# Patient Record
Sex: Male | Born: 1975 | Race: Black or African American | Hispanic: No | Marital: Single | Smoking: Current every day smoker
Health system: Southern US, Community
[De-identification: ages and names within clinical notes are randomized; demographics above are authoritative.]

## PROBLEM LIST (undated history)

## (undated) HISTORY — PX: FRACTURE SURGERY: SHX138

---

## 1998-03-18 ENCOUNTER — Emergency Department (HOSPITAL_COMMUNITY): Admission: EM | Admit: 1998-03-18 | Discharge: 1998-03-18 | Payer: Self-pay | Admitting: Emergency Medicine

## 2003-07-25 ENCOUNTER — Emergency Department (HOSPITAL_COMMUNITY): Admission: EM | Admit: 2003-07-25 | Discharge: 2003-07-25 | Payer: Self-pay | Admitting: Emergency Medicine

## 2003-08-04 ENCOUNTER — Emergency Department (HOSPITAL_COMMUNITY): Admission: EM | Admit: 2003-08-04 | Discharge: 2003-08-04 | Payer: Self-pay | Admitting: Emergency Medicine

## 2004-11-19 ENCOUNTER — Emergency Department (HOSPITAL_COMMUNITY): Admission: EM | Admit: 2004-11-19 | Discharge: 2004-11-19 | Payer: Self-pay | Admitting: Family Medicine

## 2004-11-20 ENCOUNTER — Emergency Department (HOSPITAL_COMMUNITY): Admission: EM | Admit: 2004-11-20 | Discharge: 2004-11-20 | Payer: Self-pay | Admitting: Family Medicine

## 2004-11-21 ENCOUNTER — Emergency Department (HOSPITAL_COMMUNITY): Admission: EM | Admit: 2004-11-21 | Discharge: 2004-11-21 | Payer: Self-pay | Admitting: Family Medicine

## 2004-11-22 ENCOUNTER — Emergency Department (HOSPITAL_COMMUNITY): Admission: EM | Admit: 2004-11-22 | Discharge: 2004-11-22 | Payer: Self-pay | Admitting: Family Medicine

## 2004-11-26 ENCOUNTER — Emergency Department (HOSPITAL_COMMUNITY): Admission: EM | Admit: 2004-11-26 | Discharge: 2004-11-26 | Payer: Self-pay | Admitting: Family Medicine

## 2005-12-28 ENCOUNTER — Encounter: Admission: RE | Admit: 2005-12-28 | Discharge: 2005-12-28 | Payer: Self-pay | Admitting: Sports Medicine

## 2010-06-25 ENCOUNTER — Emergency Department (HOSPITAL_COMMUNITY): Admission: EM | Admit: 2010-06-25 | Discharge: 2010-06-25 | Payer: Self-pay | Admitting: Emergency Medicine

## 2010-12-04 LAB — POCT I-STAT, CHEM 8
BUN: 13 mg/dL (ref 6–23)
Calcium, Ion: 1.11 mmol/L — ABNORMAL LOW (ref 1.12–1.32)
Chloride: 103 mEq/L (ref 96–112)
Creatinine, Ser: 1.2 mg/dL (ref 0.4–1.5)
Glucose, Bld: 108 mg/dL — ABNORMAL HIGH (ref 70–99)
HCT: 50 % (ref 39.0–52.0)
Hemoglobin: 17 g/dL (ref 13.0–17.0)
Potassium: 4.6 mEq/L (ref 3.5–5.1)
Sodium: 139 mEq/L (ref 135–145)
TCO2: 29 mmol/L (ref 0–100)

## 2012-11-23 ENCOUNTER — Emergency Department (HOSPITAL_COMMUNITY)
Admission: EM | Admit: 2012-11-23 | Discharge: 2012-11-23 | Disposition: A | Discharge status: Home / Self Care | Payer: 59 | Attending: Emergency Medicine | Admitting: Emergency Medicine

## 2012-11-23 ENCOUNTER — Emergency Department (HOSPITAL_COMMUNITY): Payer: 59

## 2012-11-23 ENCOUNTER — Encounter (HOSPITAL_COMMUNITY): Payer: Self-pay | Admitting: *Deleted

## 2012-11-23 DIAGNOSIS — Y9389 Activity, other specified: Secondary | ICD-10-CM | POA: Insufficient documentation

## 2012-11-23 DIAGNOSIS — S52121A Displaced fracture of head of right radius, initial encounter for closed fracture: Secondary | ICD-10-CM

## 2012-11-23 DIAGNOSIS — Y990 Civilian activity done for income or pay: Secondary | ICD-10-CM | POA: Insufficient documentation

## 2012-11-23 DIAGNOSIS — W11XXXA Fall on and from ladder, initial encounter: Secondary | ICD-10-CM | POA: Insufficient documentation

## 2012-11-23 DIAGNOSIS — S52123A Displaced fracture of head of unspecified radius, initial encounter for closed fracture: Secondary | ICD-10-CM | POA: Insufficient documentation

## 2012-11-23 DIAGNOSIS — Y9289 Other specified places as the place of occurrence of the external cause: Secondary | ICD-10-CM | POA: Insufficient documentation

## 2012-11-23 MED ORDER — OXYCODONE-ACETAMINOPHEN 5-325 MG PO TABS
1.0000 | ORAL_TABLET | Freq: Once | ORAL | Status: AC
  Administered 2012-11-23: 1 via ORAL
  Filled 2012-11-23: qty 1

## 2012-11-23 MED ORDER — PERCOCET 5-325 MG PO TABS: 1.0000 | ORAL_TABLET | Freq: Four times a day (QID) | ORAL | Status: DC | PRN

## 2012-11-23 MED ORDER — NAPROXEN 500 MG PO TABS: 500.0000 mg | ORAL_TABLET | Freq: Two times a day (BID) | ORAL | Status: DC

## 2012-11-23 NOTE — Progress Notes (Signed)
Orthopedic Tech Progress Note Patient Details:  Raymond Fleming 1975-10-13 [id number]  Ortho Devices Type of Ortho Device: Arm sling;Post (long arm) splint;Ace wrap Ortho Device/Splint Location: right arm Ortho Device/Splint Interventions: Application   Ruthetta Koopmann 11/23/2012, 7:08 PM

## 2012-11-23 NOTE — ED Provider Notes (Signed)
History    This chart was scribed for non-physician practitioner working with Joya Gaskins, MD by Sofie Rower, ED Scribe. This patient was seen in room TR10C/TR10C and the patient's care was started at 6:11Pm.   CSN: [id number]  Arrival date & time 11/23/12  1645   First MD Initiated Contact with Patient 11/23/12 1811      Chief Complaint  Patient presents with  . Arm Injury    (Consider location/radiation/quality/duration/timing/severity/associated sxs/prior treatment) The history is provided by the patient. No language interpreter was used.    Raymond Fleming is a 37 y.o. male , with no known medical hx,  who presents to the Emergency Department complaining of sudden, progressively worsening, arm pain, located at the RUE,  onset today (11/23/12). The pt reports he fell off a ladder (the height of the fall is reported at 8 feet) while working with a drill earlier this afternoon. At the time of the incident the pt informs he impacted directly upon his right forearm. The pt has not taken any medications PTA to relieve his right arm pain. Modifying factors include certain movements and positions which intensify the right arm pain.   The pt denies numbness, tingling, and weakness within the RUE. Furthermore, the pt denies hitting his head, LOC, and back pain.   The pt does not smoke or drink alcohol.     History reviewed. No pertinent past medical history.  History reviewed. No pertinent past surgical history.  History reviewed. No pertinent family history.  History  Substance Use Topics  . Smoking status: Not on file  . Smokeless tobacco: Not on file  . Alcohol Use: No      Review of Systems  Musculoskeletal: Positive for arthralgias.  All other systems reviewed and are negative.    Allergies  Review of patient's allergies indicates no known allergies.  Home Medications  No current outpatient prescriptions on file.  BP 139/92  Pulse 83  Temp(Src) 97.7 F (36.5  C) (Oral)  Resp 18  SpO2 97%  Physical Exam  Nursing note and vitals reviewed. Constitutional: He appears well-developed and well-nourished. No distress.  HENT:  Head: Normocephalic and atraumatic.  Eyes: Conjunctivae and EOM are normal.  Neck: Normal range of motion. Neck supple.  Cardiovascular:  Intact distal pulses, capillary refill < 3 seconds  Musculoskeletal:       Right forearm: He exhibits tenderness.  All other extremities with normal ROM  Neurological:  No sensory deficit  Skin: He is not diaphoretic.  Skin intact, no tenting    ED Course  Procedures (including critical care time)  DIAGNOSTIC STUDIES: Oxygen Saturation is 97% on room air, normal by my interpretation.    COORDINATION OF CARE:  6:47 PM- Treatment plan concerning radiology results discussed with patient and family members. Pt agrees with treatment.  Splinting also discussed with attending who agrees with discharge plan   Labs Reviewed - No data to display Dg Elbow Complete Right  11/23/2012  *RADIOLOGY REPORT*  Clinical Data: Pain post fall  RIGHT ELBOW - COMPLETE 3+ VIEW  Comparison: none  Findings: There is a mildly impacted minimally-displaced radial head fracture.  Positive elbow effusion.  Distal humerus and proximal ulna appear intact.  No dislocation. No significant osseous degenerative change. IMPRESSION:  Radial head fracture with effusion     Original Report Authenticated By: D. Andria Rhein, MD      No diagnosis found.    MDM   mechanical fall radial head fracture  Patient emergency department status post fall from ladder while at work complaining of right elbow pain.  Radial head fracture with effusion seen on imaging.  Pain managed in emergency department and placed in posterior splint.  Advised to followup with orthopedics.  Patient verbalizes understanding.  No other complaints this time.  At this time there does not appear to be any evidence of an acute emergency medical  condition and the patient appears stable for discharge with appropriate outpatient follow up.Diagnosis was discussed with patient who verbalizes understanding and is agreeable to discharge. Pt case discussed with Dr. Bebe Shaggy who agrees with my plan.      I personally performed the services described in this documentation, which was scribed in my presence. The recorded information has been reviewed and is accurate.      Jaci Carrel, [place] 11/23/12 2305

## 2012-11-23 NOTE — ED Notes (Signed)
Pt reports falling approx 78ft off ladder today, denies loc, having pain to right elbow.

## 2012-11-25 NOTE — ED Provider Notes (Signed)
Medical screening examination/treatment/procedure(s) were performed by non-physician practitioner and as supervising physician I was immediately available for consultation/collaboration.   Joya Gaskins, MD 11/25/12 [phone]

## 2013-07-17 IMAGING — CR DG ELBOW COMPLETE 3+V*R*
4 series · 4 of 4 positions shown · non-contrast
Comparison: none

CLINICAL DATA: Pain post fall

RIGHT ELBOW - COMPLETE 3+ VIEW

[x elbow joint ap right (1 of 2)]
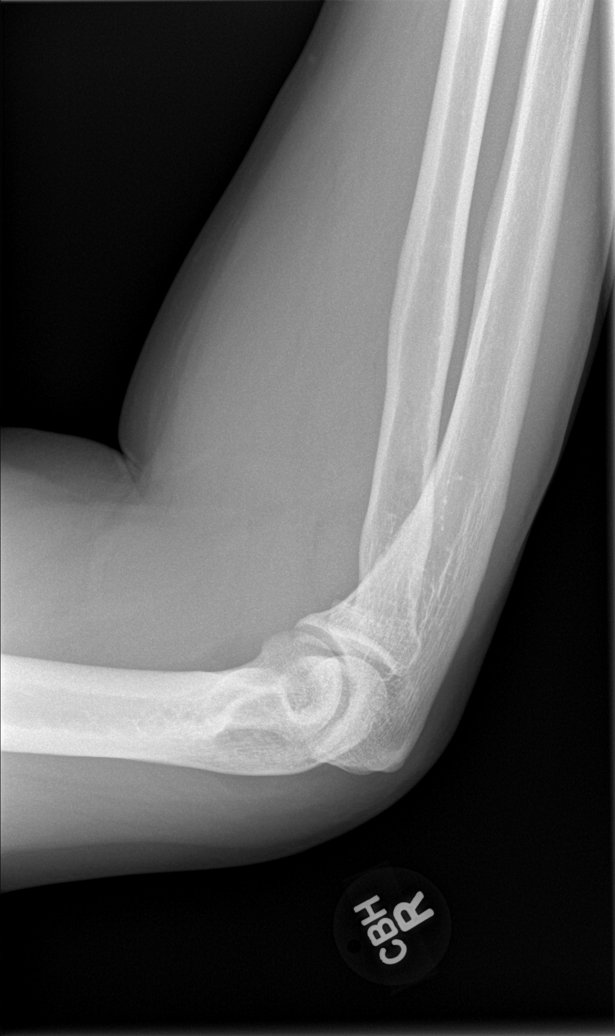

[x elbow joint ap right (2 of 2)]
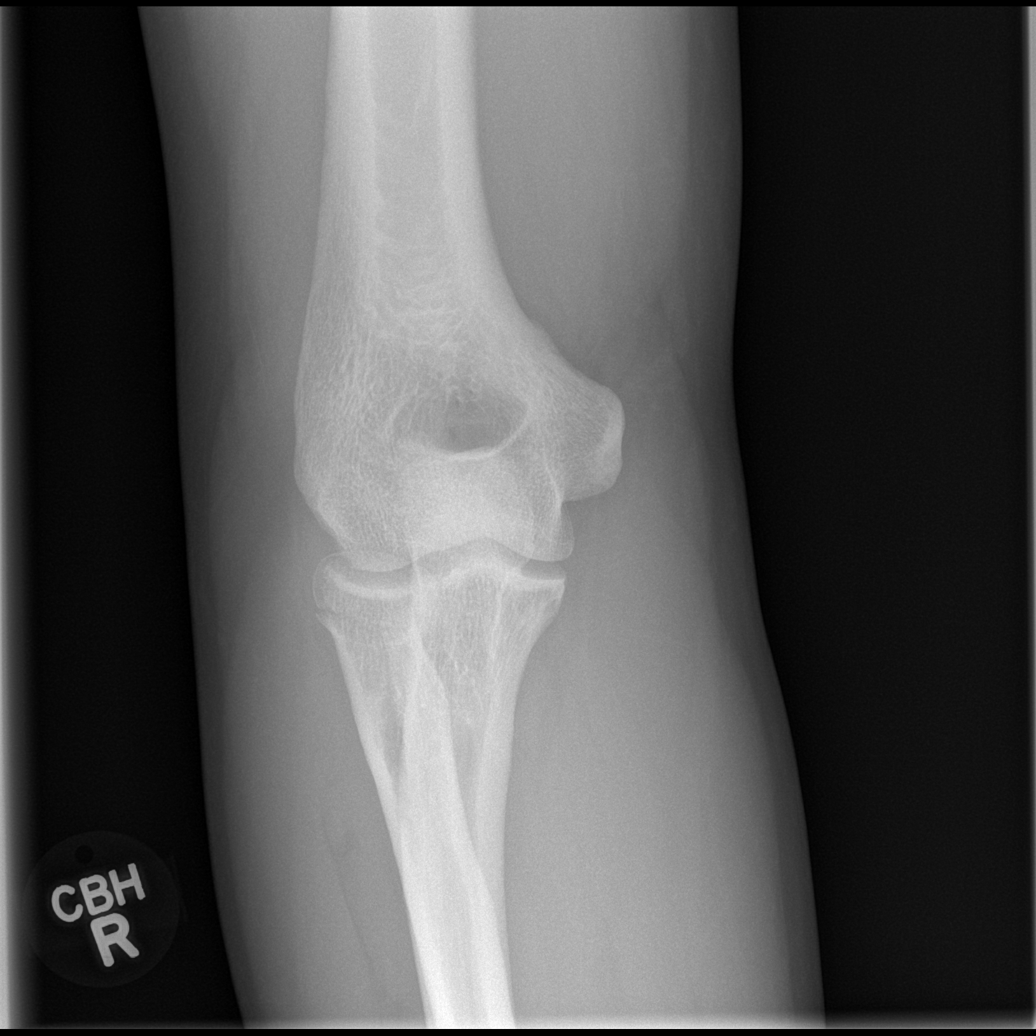

[x elbow joint obl. right (1 of 2)]
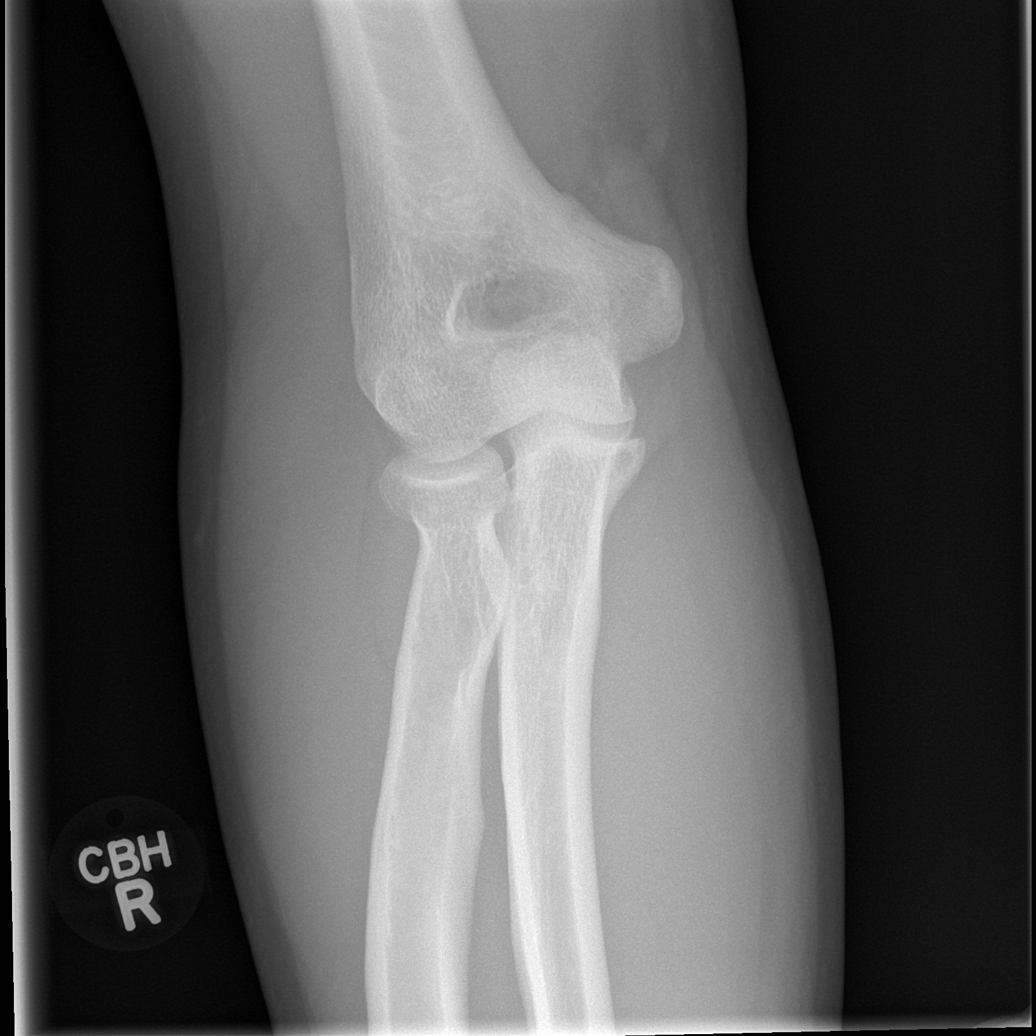

[x elbow joint obl. right (2 of 2)]
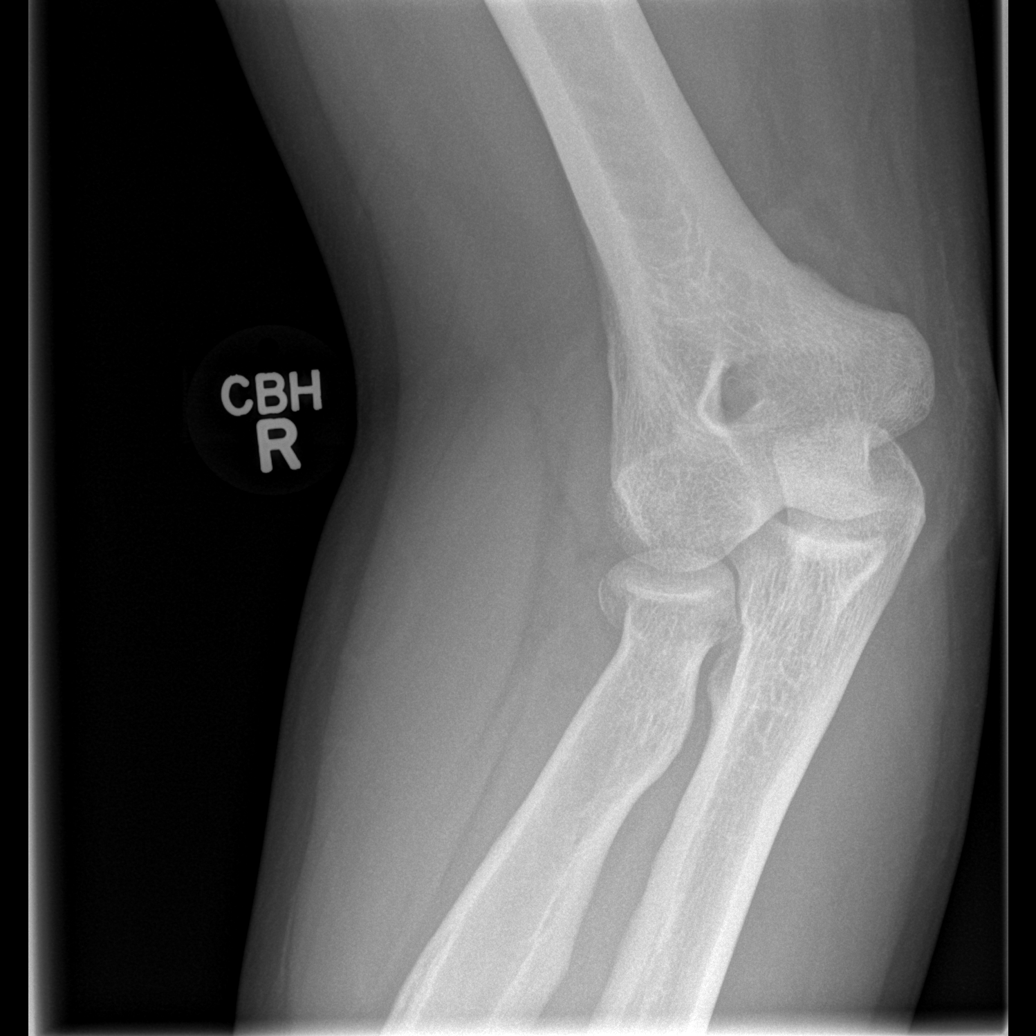

[4 of 4 positions shown; findings below may reference images not displayed]

FINDINGS: There is a mildly impacted minimally-displaced radial
head fracture.  Positive elbow effusion.  Distal humerus and
proximal ulna appear intact.  No dislocation.} No significant
osseous degenerative change.
IMPRESSION: Radial head fracture with effusion

## 2014-04-27 ENCOUNTER — Ambulatory Visit (INDEPENDENT_AMBULATORY_CARE_PROVIDER_SITE_OTHER): Payer: 59 | Admitting: Emergency Medicine

## 2014-04-27 VITALS — BP 126/80 | HR 66 | Temp 98.0°F | Resp 17 | Ht 72.0 in | Wt 245.0 lb

## 2014-04-27 DIAGNOSIS — L509 Urticaria, unspecified: Secondary | ICD-10-CM

## 2014-04-27 DIAGNOSIS — S60569A Insect bite (nonvenomous) of unspecified hand, initial encounter: Secondary | ICD-10-CM

## 2014-04-27 DIAGNOSIS — S60561A Insect bite (nonvenomous) of right hand, initial encounter: Secondary | ICD-10-CM

## 2014-04-27 DIAGNOSIS — W57XXXA Bitten or stung by nonvenomous insect and other nonvenomous arthropods, initial encounter: Principal | ICD-10-CM

## 2014-04-27 MED ORDER — EPINEPHRINE 0.3 MG/0.3ML IJ SOAJ: 0.3000 mg | Freq: Once | INTRAMUSCULAR | Status: AC

## 2014-04-27 MED ORDER — PREDNISONE 10 MG PO TABS: ORAL_TABLET | ORAL | Status: DC

## 2014-04-27 MED ORDER — EPINEPHRINE 0.3 MG/0.3ML IJ SOAJ: 0.3000 mg | Freq: Once | INTRAMUSCULAR | Status: DC

## 2014-04-27 NOTE — Patient Instructions (Signed)

## 2014-04-27 NOTE — Progress Notes (Addendum)
   Subjective:    Patient ID: Raymond Fleming, male    DOB: 11-29-1975, 38 y.o.   MRN: [id number] This chart was scribed for Collene GobbleSteven A Chriss Redel, MD by Julian HyMorgan Graham, ED Scribe. The patient was seen in Room 9. The patient's care was started at 11:30 AM.   Chief Complaint  Patient presents with  . Insect Bite    hand   History reviewed. No pertinent past medical history.  HPI HPI Comments: Raymond Fleming is a 38 y.o. male who presents to the [facility] complaining of bug bite on his right thumb onset last night. Pt reports hives all over his legs bilaterally and swelling in his right hand. Pt denies being in any painPt reports he went to bring his dog in last night and as he reached for the dog bowl, he felt a sharp pain that he states felt like a sting. Pt reports he was unable to locate what stung him. Pt reports he took Benadryl with some relief. Pt denies issues breathing, nausea, vomiting, fever or chills.  Review of Systems  Constitutional: Negative for fever and chills.  Gastrointestinal: Negative for nausea and vomiting.  Musculoskeletal: Positive for joint swelling (right hand). Negative for arthralgias.    Objective:   Physical Exam CONSTITUTIONAL: Well developed/well nourished HEAD: Normocephalic/atraumatic EYES: EOMI/PERRL ENMT: Mucous membranes moist NECK: supple no meningeal signs SPINE:entire spine nontender CV: S1/S2 noted, no murmurs/rubs/gallops noted LUNGS: Lungs are clear to auscultation bilaterally, no apparent distress ABDOMEN: soft, nontender, no rebound or guarding GU:no cva tenderness NEURO: Pt is awake/alert, moves all extremitiesx4 EXTREMITIES: pulses normal, full ROM.  SKIN: warm, color normal. Swelling over the dorsum of the hand and ring finger. Hives are no longer present. PSYCH: no abnormalities of mood noted  Filed Vitals:   04/27/14 1105  BP: 126/80  Pulse: 66  Temp: 98 F (36.7 C)  TempSrc: Oral  Resp: 17  Height: 6'  (1.829 m)  Weight: 245 lb (111.131 kg)  SpO2: 99%   Meds ordered this encounter  Medications  . DISCONTD: EPINEPHrine 0.3 mg/0.3 mL IJ SOAJ injection    Sig: Inject 0.3 mLs (0.3 mg total) into the muscle once.    Dispense:  1 Device    Refill:  0  . EPINEPHrine 0.3 mg/0.3 mL IJ SOAJ injection    Sig: Inject 0.3 mLs (0.3 mg total) into the muscle once.    Dispense:  1 Device    Refill:  0  . predniSONE (DELTASONE) 10 MG tablet    Sig: Take 6 a day for one day 5 a day for one day 4  A day for one day 3 a day for one   2 a day for one day one a day for one day    Dispense:  21 tablet    Refill:  0    Assessment & Plan:  11:34 AM- Patient informed of current plan for treatment and evaluation and agrees with plan at this time. Patient given prescriptions for prednisone and taper EpiPen instructed to stay away from the dog lot and have somebody else check the area.

## 2015-08-14 ENCOUNTER — Ambulatory Visit (INDEPENDENT_AMBULATORY_CARE_PROVIDER_SITE_OTHER): Payer: Commercial Managed Care - HMO | Admitting: Emergency Medicine

## 2015-08-14 VITALS — BP 114/84 | HR 77 | Temp 98.5°F | Resp 16 | Ht 72.0 in | Wt 238.0 lb

## 2015-08-14 DIAGNOSIS — R369 Urethral discharge, unspecified: Secondary | ICD-10-CM | POA: Diagnosis not present

## 2015-08-14 LAB — POC MICROSCOPIC URINALYSIS (UMFC)

## 2015-08-14 LAB — POCT URINALYSIS DIP (MANUAL ENTRY)
Glucose, UA: NEGATIVE
Leukocytes, UA: NEGATIVE
Nitrite, UA: NEGATIVE
RBC UA: NEGATIVE
Spec Grav, UA: >=1.03
Urobilinogen, UA: 1
pH, UA: 5.5

## 2015-08-14 MED ORDER — DOXYCYCLINE HYCLATE 100 MG PO CAPS: 100.0000 mg | ORAL_CAPSULE | Freq: Two times a day (BID) | ORAL | Status: DC

## 2015-08-14 MED ORDER — VALACYCLOVIR HCL 1 G PO TABS: 1000.0000 mg | ORAL_TABLET | Freq: Two times a day (BID) | ORAL | Status: DC

## 2015-08-14 NOTE — Patient Instructions (Signed)

## 2015-08-14 NOTE — Progress Notes (Signed)
Chief Complaint:  Chief Complaint  Patient presents with  . penile discharge    x 2 days    HPI: Raymond Fleming is a 39 y.o. male who is here for    History reviewed. No pertinent past medical history. Past Surgical History  Procedure Laterality Date  . Fracture surgery     Social History   Social History  . Marital Status: Single    Spouse Name: N/A  . Number of Children: N/A  . Years of Education: N/A   Social History Main Topics  . Smoking status: Current Every Day Smoker    Types: Cigars  . Smokeless tobacco: None  . Alcohol Use: No  . Drug Use: No  . Sexual Activity: No   Other Topics Concern  . None   Social History Narrative   Family History  Problem Relation Age of Onset  . Diabetes Father    Allergies  Allergen Reactions  . Bee Venom Hives   Prior to Admission medications   Medication Sig Start Date End Date Taking? Authorizing Provider  EPINEPHrine 0.3 mg/0.3 mL IJ SOAJ injection Inject 0.3 mLs (0.3 mg total) into the muscle once. 04/27/14  Yes Collene Gobble, MD     ROS: The patient denies fevers, chills, night sweats, unintentional weight loss, chest pain, palpitations, wheezing, dyspnea on exertion, nausea, vomiting, abdominal pain, dysuria, hematuria, melena, numbness, weakness, or tingling.   All other systems have been reviewed and were otherwise negative with the exception of those mentioned in the HPI and as above.    PHYSICAL EXAM: Filed Vitals:   08/14/15 1240  BP: 114/84  Pulse: 77  Temp: 98.5 F (36.9 C)  Resp: 16   Filed Vitals:   08/14/15 1240  Height: 6' (1.829 m)  Weight: 238 lb (107.956 kg)   Body mass index is 32.27 kg/(m^2).   General: Alert, no acute distress HEENT:  Normocephalic, atraumatic, oropharynx patent. EOMI, PERRLA Cardiovascular:  Regular rate and rhythm, no rubs murmurs or gallops.  No Carotid bruits, radial pulse intact. No pedal edema.  Respiratory: Clear to auscultation bilaterally.  No  wheezes, rales, or rhonchi.  No cyanosis, no use of accessory musculature GI: No organomegaly, abdomen is soft and non-tender, positive bowel sounds.  No masses. Skin: No rashes. Neurologic: Facial musculature symmetric. Psychiatric: Patient is appropriate throughout our interaction. Lymphatic: No cervical lymphadenopathy Musculoskeletal: Gait intact.   LABS:  Results for orders placed or performed in visit on 08/14/15  POCT Microscopic Urinalysis ([facility])  Result Value Ref Range   WBC,UR,HPF,POC Few (A) None WBC/hpf   RBC,UR,HPF,POC None None RBC/hpf   Bacteria None None, Too numerous to count   Mucus Present (A) Absent   Epithelial Cells, UR Per Microscopy Moderate (A) None, Too numerous to count cells/hpf  POCT urinalysis dipstick  Result Value Ref Range   Color, UA other (A) yellow   Clarity, UA clear clear   Glucose, UA negative negative   Bilirubin, UA small (A) negative   Ketones, POC UA trace (5) (A) negative   Spec Grav, UA >=1.030    Blood, UA negative negative   pH, UA 5.5    Protein Ur, POC =30 (A) negative   Urobilinogen, UA 1.0    Nitrite, UA Negative Negative   Leukocytes, UA Negative Negative    EKG/XRAY:   Primary read interpreted by Dr. Cleta Alberts at [facility]   ASSESSMENT/PLAN: 1. Abnormal penile discharge Patient had a suspected herpes outbreak about 2  weeks ago. His partner tested positive for HSV. Will treat with Valtrex and doxycycline. Will treat with  Rocephin if GC is positive. On examination he did not have a  discharge. Routine STD screening was done. - POCT Microscopic Urinalysis ([facility]) - POCT urinalysis dipstick     Gross sideeffects, risk and benefits, and alternatives of medications d/w patient. Patient is aware that all medications have potential sideeffects and we are unable to predict every sideeffect or drug-drug interaction that may occur.  @[facility] @ 08/14/2015 12:51 PM

## 2015-08-15 LAB — GC/CHLAMYDIA PROBE AMP
CT Probe RNA: NEGATIVE
GC Probe RNA: NEGATIVE

## 2015-08-15 LAB — HIV ANTIBODY (ROUTINE TESTING W REFLEX): HIV 1&2 Ab, 4th Generation: NONREACTIVE

## 2015-08-15 LAB — RPR

## 2015-08-15 LAB — HEPATITIS C ANTIBODY: HCV Ab: NEGATIVE

## 2015-08-16 LAB — HSV(HERPES SIMPLEX VRS) I + II AB-IGG
HSV 1 Glycoprotein G Ab, IgG: <0.1 IV
HSV 2 Glycoprotein G Ab, IgG: 9.09 IV — ABNORMAL HIGH

## 2015-08-19 MED ORDER — VALACYCLOVIR HCL 500 MG PO TABS: 500.0000 mg | ORAL_TABLET | Freq: Every day | ORAL | Status: AC

## 2015-08-19 NOTE — Addendum Note (Signed)
Addended by: Johnnette LitterARDWELL, Shwanda Soltis M on: 08/19/2015 10:07 AM   Modules accepted: Orders

## 2017-09-25 ENCOUNTER — Other Ambulatory Visit: Payer: Self-pay

## 2017-09-25 ENCOUNTER — Encounter: Payer: Self-pay | Admitting: Emergency Medicine

## 2017-09-25 ENCOUNTER — Ambulatory Visit (INDEPENDENT_AMBULATORY_CARE_PROVIDER_SITE_OTHER): Payer: Worker's Compensation

## 2017-09-25 ENCOUNTER — Ambulatory Visit
Admission: EM | Admit: 2017-09-25 | Discharge: 2017-09-25 | Disposition: A | Discharge status: Home / Self Care | Payer: Worker's Compensation | Attending: Emergency Medicine | Admitting: Emergency Medicine

## 2017-09-25 DIAGNOSIS — S93422A Sprain of deltoid ligament of left ankle, initial encounter: Secondary | ICD-10-CM

## 2017-09-25 MED ORDER — IBUPROFEN 600 MG PO TABS: 600.0000 mg | ORAL_TABLET | Freq: Four times a day (QID) | ORAL | 0 refills | Status: AC | PRN

## 2017-09-25 NOTE — ED Triage Notes (Signed)
Patient states that he was reaching up and stepped on a cardboard and slipped and twisted his left ankle about 2-3 weeks ago.  Patient c/o pain on the inside of his left ankle.

## 2017-09-25 NOTE — ED Provider Notes (Signed)
HPI  SUBJECTIVE:  Raymond Fleming is a 42 y.o. male who presents with left ankle pain for the past 2-3 weeks.  States that he was stepping off a platform, slipped him some cardboard and twisted his ankle.  He is not sure if he inverted or everted it.  He reports swelling medially, and describes the pain as soreness that becomes sharp depending on his foot movement.  He reports popping with ankle range of motion.  No bruising, distal numbness or tingling.  He tried tiger balm, ice, elevation, TENS unit and ibuprofen 800 mg twice a day.  Symptoms are better with rest.  Symptoms are worse with walking and weightbearing.  He has a past medical history of injury to this ankle, but no history of diabetes, hypertension.  This is a Designer, multimedia. case, Production designer, theatre/television/film came with the patient and he states that patient has an appointment with occupational health on Monday and that they will do the restrictions at that time.  History reviewed. No pertinent past medical history.  Past Surgical History:  Procedure Laterality Date  . FRACTURE SURGERY      Family History  Problem Relation Age of Onset  . Diabetes Father     Social History   Tobacco Use  . Smoking status: Current Every Day Smoker    Types: Cigars  . Smokeless tobacco: Never Used  Substance Use Topics  . Alcohol use: No  . Drug use: No    No current facility-administered medications for this encounter.   Current Outpatient Medications:  .  EPINEPHrine 0.3 mg/0.3 mL IJ SOAJ injection, Inject 0.3 mLs (0.3 mg total) into the muscle once., Disp: 1 Device, Rfl: 0 .  ibuprofen (ADVIL,MOTRIN) 600 MG tablet, Take 1 tablet (600 mg total) by mouth every 6 (six) hours as needed., Disp: 30 tablet, Rfl: 0 .  valACYclovir (VALTREX) 500 MG tablet, Take 1 tablet (500 mg total) by mouth daily., Disp: 30 tablet, Rfl: 11  Allergies  Allergen Reactions  . Bee Venom Hives     ROS  As noted in HPI.   Physical Exam  BP 134/88 (BP Location: Left  Arm)   Pulse 61   Temp 97.9 F (36.6 C) (Oral)   Resp 16   Ht 6' (1.829 m)   Wt 250 lb (113.4 kg)   SpO2 99%   BMI 33.91 kg/m   Constitutional: Well developed, well nourished, no acute distress Eyes:  EOMI, conjunctiva normal bilaterally HENT: Normocephalic, atraumatic,mucus membranes moist Respiratory: Normal inspiratory effort Cardiovascular: Normal rate GI: nondistended skin: No rash, skin intact Musculoskeletal: L Ankle  Proximal fibula NT , Distal fibula NT , Medial malleolus NT,  Deltoid ligament medially tender,  Lateral ligaments NT, ATFL laterally NT, posterior tablofibular ligament laterally  NT , calcaneofibular ligament laterally NT,  Achilles NT, calcaneus  NT,  Proximal 5th metatarsal NT, Midfoot NT, distal NVI with baseline sensation / motor to foot with CR<2 seconds. no pain with dorsiflexion/plantar flexion. no pain with inversion/eversion. - bruising. - squeeze test.  Ant drawer test stable. Pt able to bear weight in dept.  Neurologic: Alert & oriented x 3, no focal neuro deficits Psychiatric: Speech and behavior appropriate   ED Course   Medications - No data to display  Orders Placed This Encounter  Procedures  . DG Ankle Complete Left    Standing Status:   Standing    Number of Occurrences:   1    Order Specific Question:   Reason for Exam (SYMPTOM  OR DIAGNOSIS REQUIRED)    Answer:   Left ankle pain due to injury  . Apply ASO ankle    Standing Status:   Standing    Number of Occurrences:   1    Order Specific Question:   Laterality    Answer:   Left    No results found for this or any previous visit (from the past 24 hour(s)). Dg Ankle Complete Left  Result Date: 09/25/2017 CLINICAL DATA:  42 year old male with persistent left ankle pain and swelling after injuring it 2 or 3 weeks previously. EXAM: LEFT ANKLE COMPLETE - 3+ VIEW COMPARISON:  None. FINDINGS: Mild soft tissue swelling about the ankle. No evidence of acute fracture or malalignment. Mild  degenerative change in the medial aspect of the tibiotalar joint. No lytic or blastic osseous lesion. IMPRESSION: 1. Mild soft tissue swelling without evidence of acute fracture or malalignment. 2. 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 Electronically Signed   By: Malachy MoanHeath  McCullough M.D.   On: 09/25/2017 10:59    ED Clinical Impression  Sprain of deltoid ligament of left ankle, initial encounter   ED Assessment/Plan   Narcotic database reviewed for this patient, and feel that the risk/benefit ratio today is favorable for proceeding with a prescription for controlled substance.  Reviewed imaging independently.  Soft tissue swelling.  No fracture.  Degenerative changes in the medial tibiotalar joint.  See radiology report for full details.  Dictation consistent with a sprained ankle.  Will send home with ibuprofen 600 mg to take with 1 g of Tylenol 3-4 times a day as needed for pain.  Patient declined a prescription of narcotics.  Using an ASO.  He has follow-up arranged with occupational health on Monday.  Patient's manager states that they will do the restrictions at that time.  Discussed  imaging, MDM, plan and followup with patient. patient agrees with plan.   Meds ordered this encounter  Medications  . ibuprofen (ADVIL,MOTRIN) 600 MG tablet    Sig: Take 1 tablet (600 mg total) by mouth every 6 (six) hours as needed.    Dispense:  30 tablet    Refill:  0    *This clinic note was created using Scientist, clinical (histocompatibility and immunogenetics)Dragon dictation software. Therefore, there may be occasional mistakes despite careful proofreading.   ?   Domenick GongMortenson, Gao Mitnick, MD 09/25/17 1244

## 2017-09-25 NOTE — Discharge Instructions (Signed)
Take the medication as written. Take 1 gram of tylenol with the motrin up to 4 times a day as needed for pain and fever. This is an effective combination for pain.  Do not exceed 4 grams of tylenol a day from all sources.,  Continue ice, elevation.  Wear the ASO as needed for comfort.  Go to www.goodrx.com to look up your medications. This will give you a list of where you can find your prescriptions at the most affordable prices. Or ask the pharmacist what the cash price is, or if they have any other discount programs available to help make your medication more affordable. This can be less expensive than what you would pay with insurance.

## 2018-05-19 IMAGING — CR DG ANKLE COMPLETE 3+V*L*
3 series · 3 of 3 positions shown · non-contrast
Comparison: None.

CLINICAL DATA: 41-year-old male with persistent left ankle pain and
swelling after injuring it 2 or 3 weeks previously.

EXAM:
LEFT ANKLE COMPLETE - 3+ VIEW

[ankle ap]
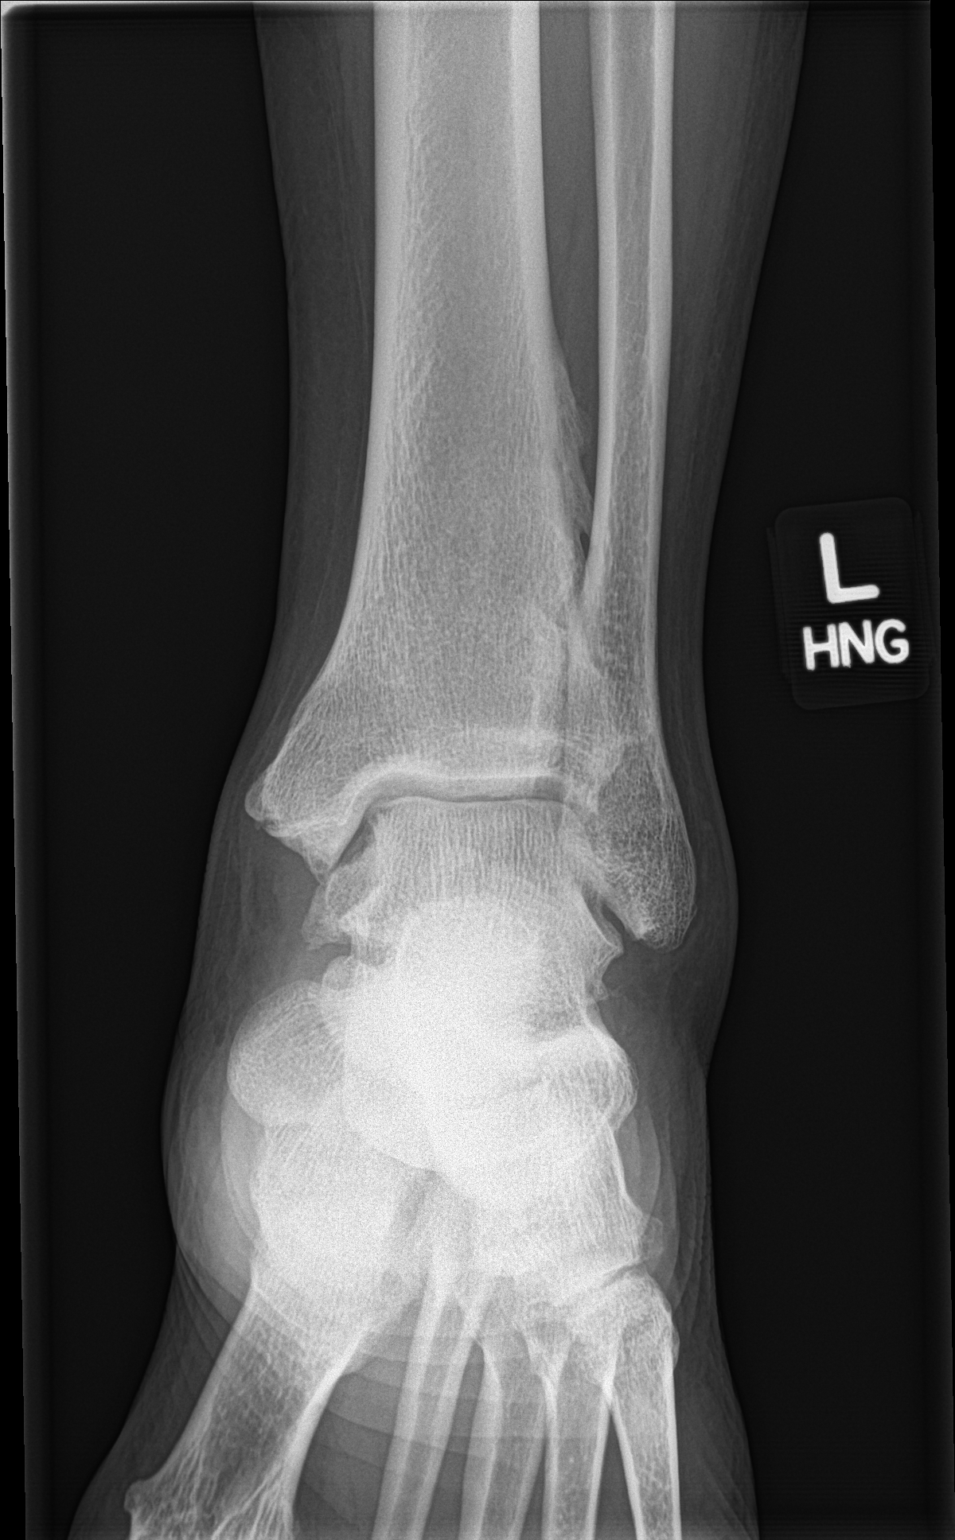

[ankle obl]
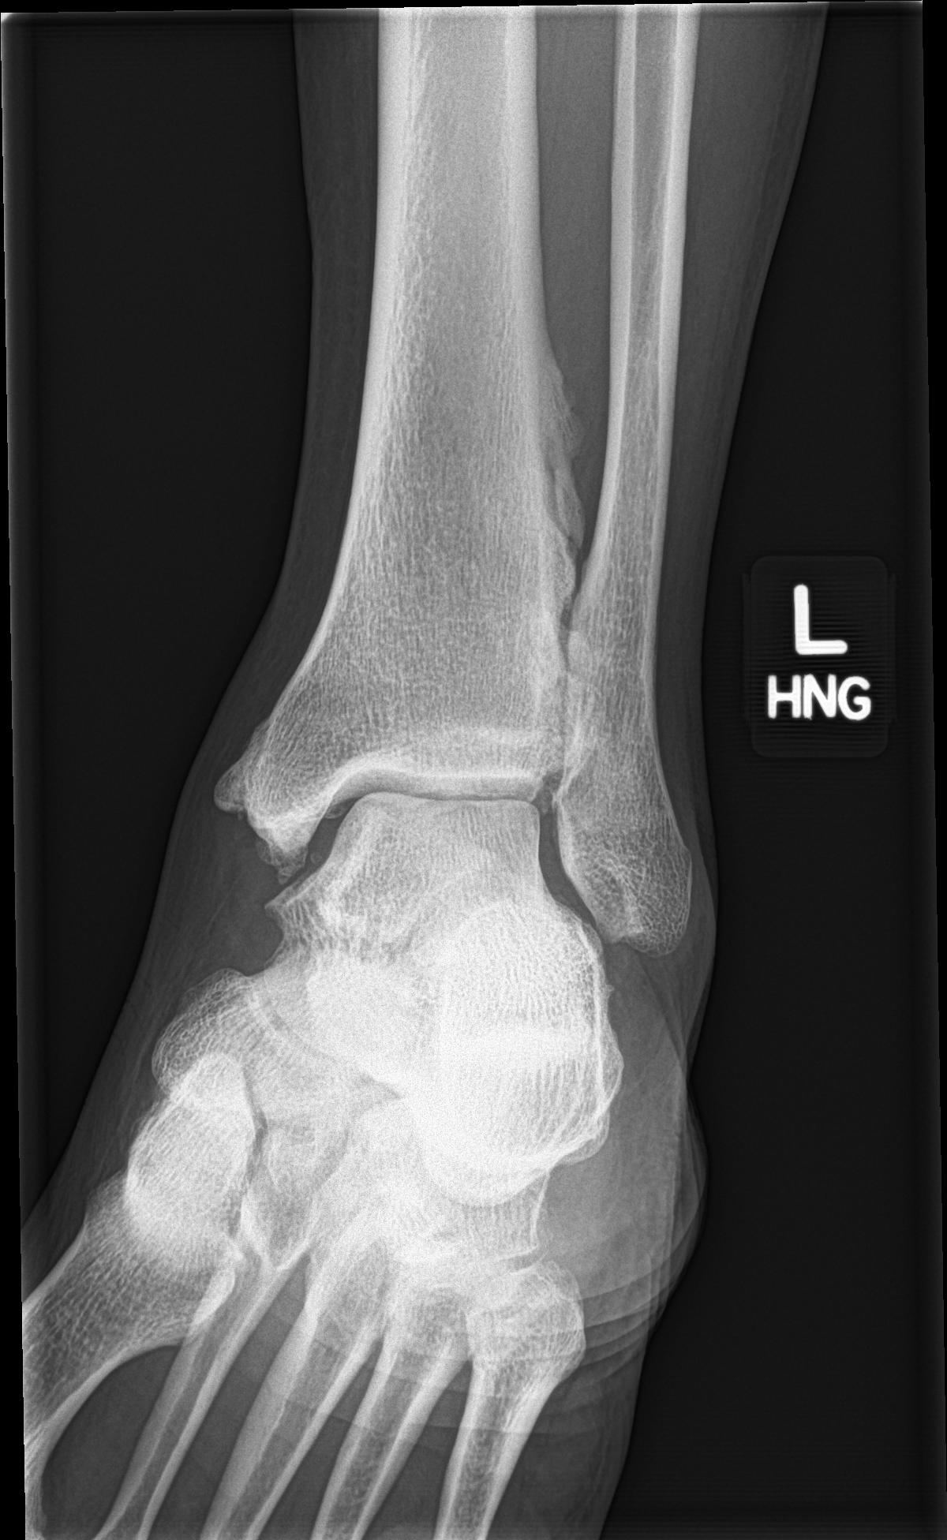

[ankle lat]
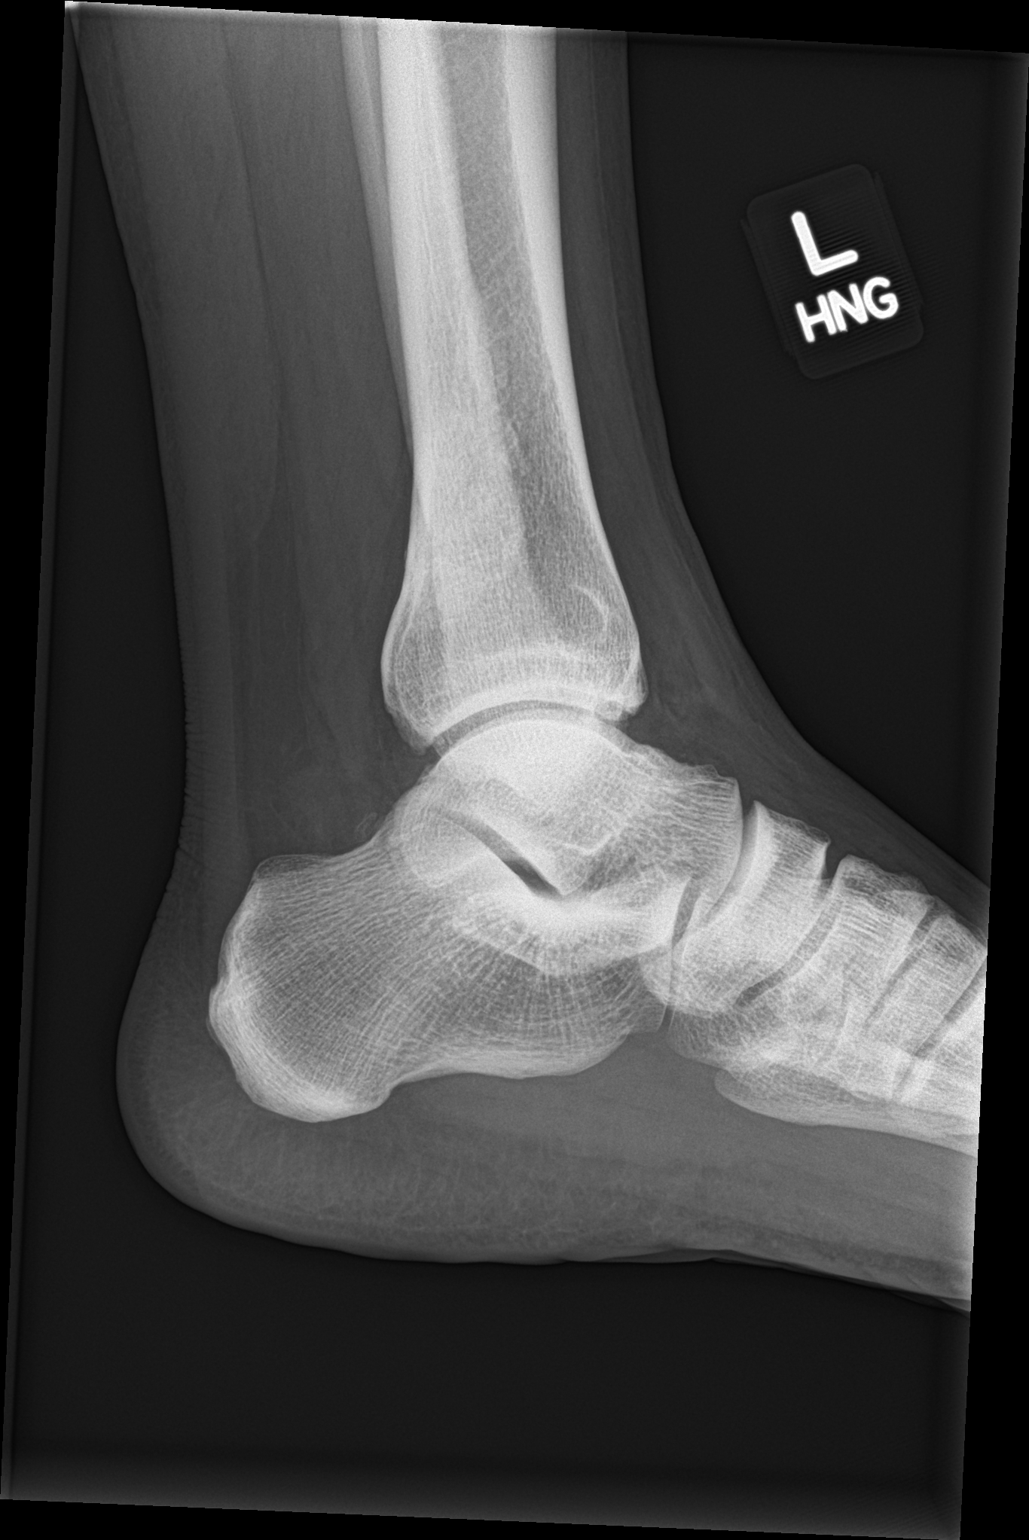

[3 of 3 positions shown; findings below may reference images not displayed]

FINDINGS: Mild soft tissue swelling about the ankle. No evidence of acute
fracture or malalignment. Mild degenerative change in the medial
aspect of the tibiotalar joint. No lytic or blastic osseous lesion.
IMPRESSION: 1. Mild soft tissue swelling without evidence of acute fracture or
malalignment.
2. Mild degenerative changes in the medial tibiotalar joint.
